# Patient Record
Sex: Male | Born: 1940 | Race: Black or African American | Hispanic: No | Marital: Married | Smoking: Current every day smoker
Health system: Southern US, Community
[De-identification: ages and names within clinical notes are randomized; demographics above are authoritative.]

## PROBLEM LIST (undated history)

## (undated) ENCOUNTER — Emergency Department (HOSPITAL_COMMUNITY): Admission: EM | Payer: Medicare Other | Source: Home / Self Care

## (undated) DIAGNOSIS — I10 Essential (primary) hypertension: Secondary | ICD-10-CM

## (undated) DIAGNOSIS — M1990 Unspecified osteoarthritis, unspecified site: Secondary | ICD-10-CM

## (undated) HISTORY — DX: Essential (primary) hypertension: I10

## (undated) HISTORY — DX: Unspecified osteoarthritis, unspecified site: M19.90

---

## 2010-04-08 ENCOUNTER — Emergency Department (HOSPITAL_COMMUNITY): Admission: EM | Admit: 2010-04-08 | Discharge: 2010-04-08 | Payer: Self-pay | Admitting: Family Medicine

## 2010-10-31 ENCOUNTER — Emergency Department (HOSPITAL_COMMUNITY): Admission: EM | Admit: 2010-10-31 | Discharge: 2010-10-31 | Payer: Self-pay | Admitting: Emergency Medicine

## 2010-10-31 ENCOUNTER — Emergency Department (HOSPITAL_COMMUNITY): Admission: EM | Admit: 2010-10-31 | Discharge: 2010-10-31 | Payer: Self-pay | Admitting: Family Medicine

## 2011-02-27 LAB — SYNOVIAL CELL COUNT + DIFF, W/ CRYSTALS: Monocyte-Macrophage-Synovial Fluid: 3 % — ABNORMAL LOW (ref 50–90)

## 2011-06-23 ENCOUNTER — Emergency Department (HOSPITAL_COMMUNITY)
Admission: EM | Admit: 2011-06-23 | Discharge: 2011-06-24 | Disposition: A | Discharge status: Home / Self Care | Payer: Medicare HMO | Attending: Emergency Medicine | Admitting: Emergency Medicine

## 2011-06-23 DIAGNOSIS — I10 Essential (primary) hypertension: Secondary | ICD-10-CM | POA: Insufficient documentation

## 2011-06-23 DIAGNOSIS — L03119 Cellulitis of unspecified part of limb: Secondary | ICD-10-CM | POA: Insufficient documentation

## 2011-06-23 DIAGNOSIS — M109 Gout, unspecified: Secondary | ICD-10-CM | POA: Insufficient documentation

## 2011-06-23 DIAGNOSIS — M79609 Pain in unspecified limb: Secondary | ICD-10-CM | POA: Insufficient documentation

## 2011-06-23 DIAGNOSIS — M25476 Effusion, unspecified foot: Secondary | ICD-10-CM | POA: Insufficient documentation

## 2011-06-23 DIAGNOSIS — L02619 Cutaneous abscess of unspecified foot: Secondary | ICD-10-CM | POA: Insufficient documentation

## 2011-06-23 DIAGNOSIS — M25473 Effusion, unspecified ankle: Secondary | ICD-10-CM | POA: Insufficient documentation

## 2011-06-24 ENCOUNTER — Emergency Department (HOSPITAL_COMMUNITY): Payer: Medicare HMO

## 2011-06-24 LAB — BASIC METABOLIC PANEL
BUN: 19 mg/dL (ref 6–23)
Calcium: 9.4 mg/dL (ref 8.4–10.5)
Creatinine, Ser: 0.8 mg/dL (ref 0.50–1.35)
GFR calc non Af Amer: >60 mL/min (ref >60)
Potassium: 3.3 mEq/L — ABNORMAL LOW (ref 3.5–5.1)
Sodium: 136 mEq/L (ref 135–145)

## 2011-06-24 LAB — CBC
HCT: 41.8 % (ref 39.0–52.0)
Hemoglobin: 14.2 g/dL (ref 13.0–17.0)
RDW: 15.2 % (ref 11.5–15.5)
WBC: 6.7 10*3/uL (ref 4.0–10.5)

## 2011-06-24 LAB — DIFFERENTIAL
Basophils Relative: 0 % (ref 0–1)
Eosinophils Absolute: 0.3 10*3/uL (ref 0.0–0.7)
Eosinophils Relative: 4 % (ref 0–5)
Lymphs Abs: 2 10*3/uL (ref 0.7–4.0)
Monocytes Absolute: 0.8 10*3/uL (ref 0.1–1.0)
Monocytes Relative: 12 % (ref 3–12)
Neutro Abs: 3.5 10*3/uL (ref 1.7–7.7)

## 2013-01-23 IMAGING — CR DG FOOT COMPLETE 3+V*R*
3 series · 3 of 3 positions shown · non-contrast
Comparison: None.

CLINICAL DATA: Pain and swelling

RIGHT FOOT COMPLETE - 3+ VIEW

[t foot ap right *]
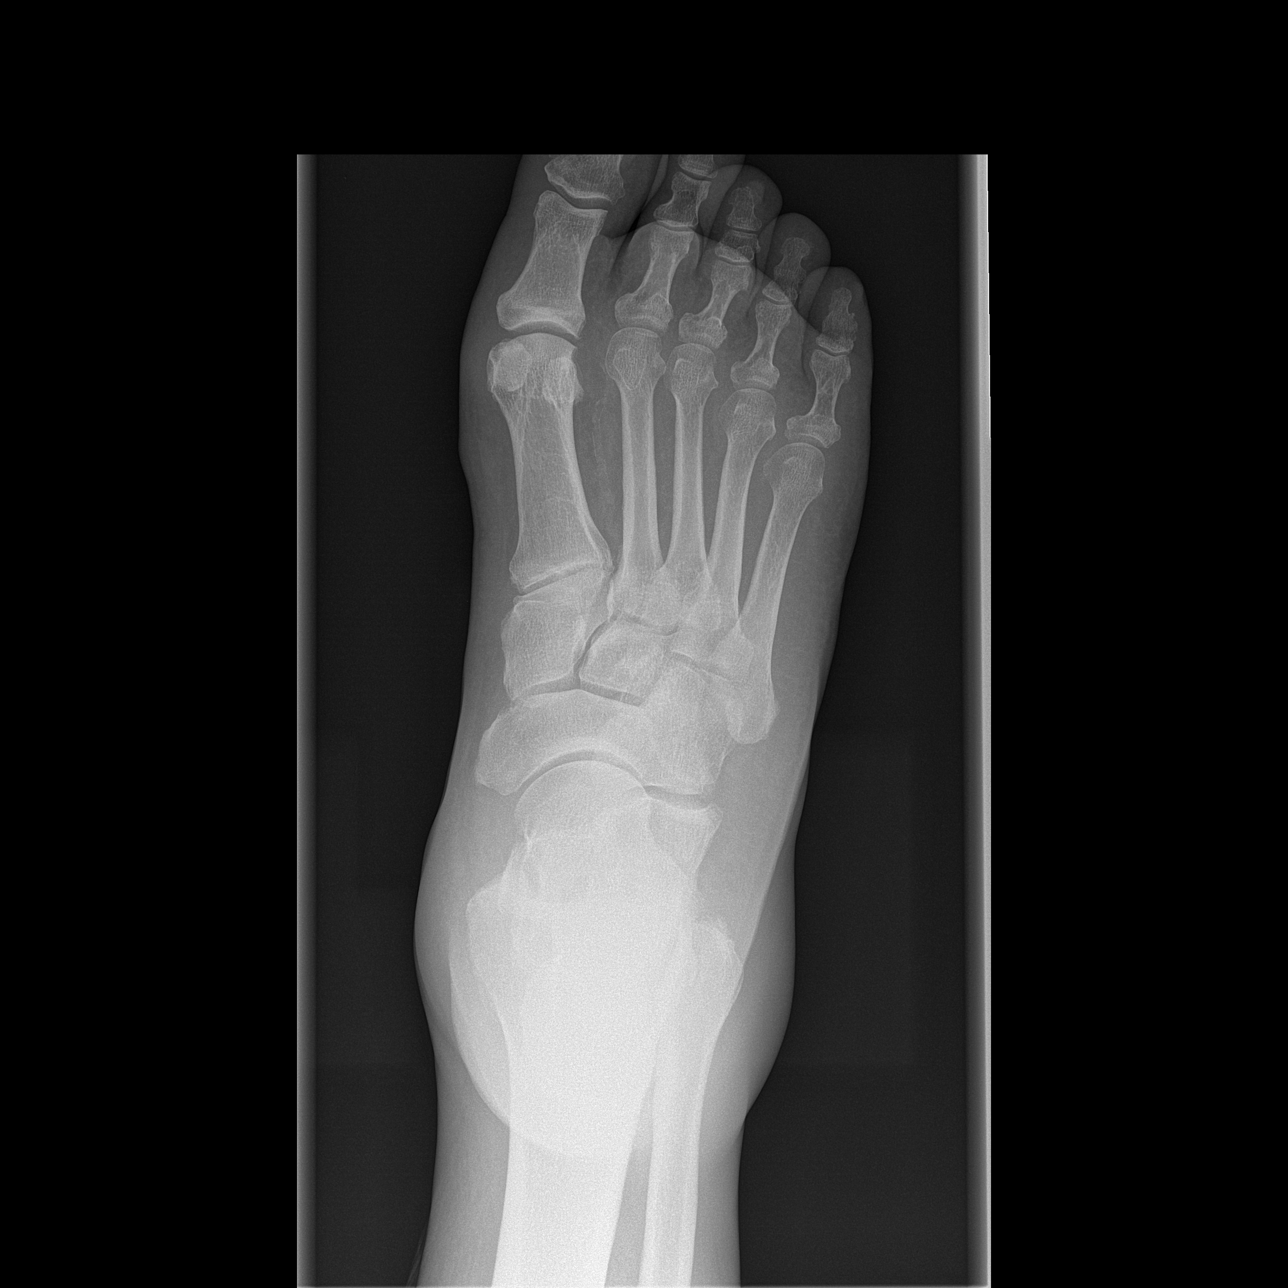

[t foot oblique right *]
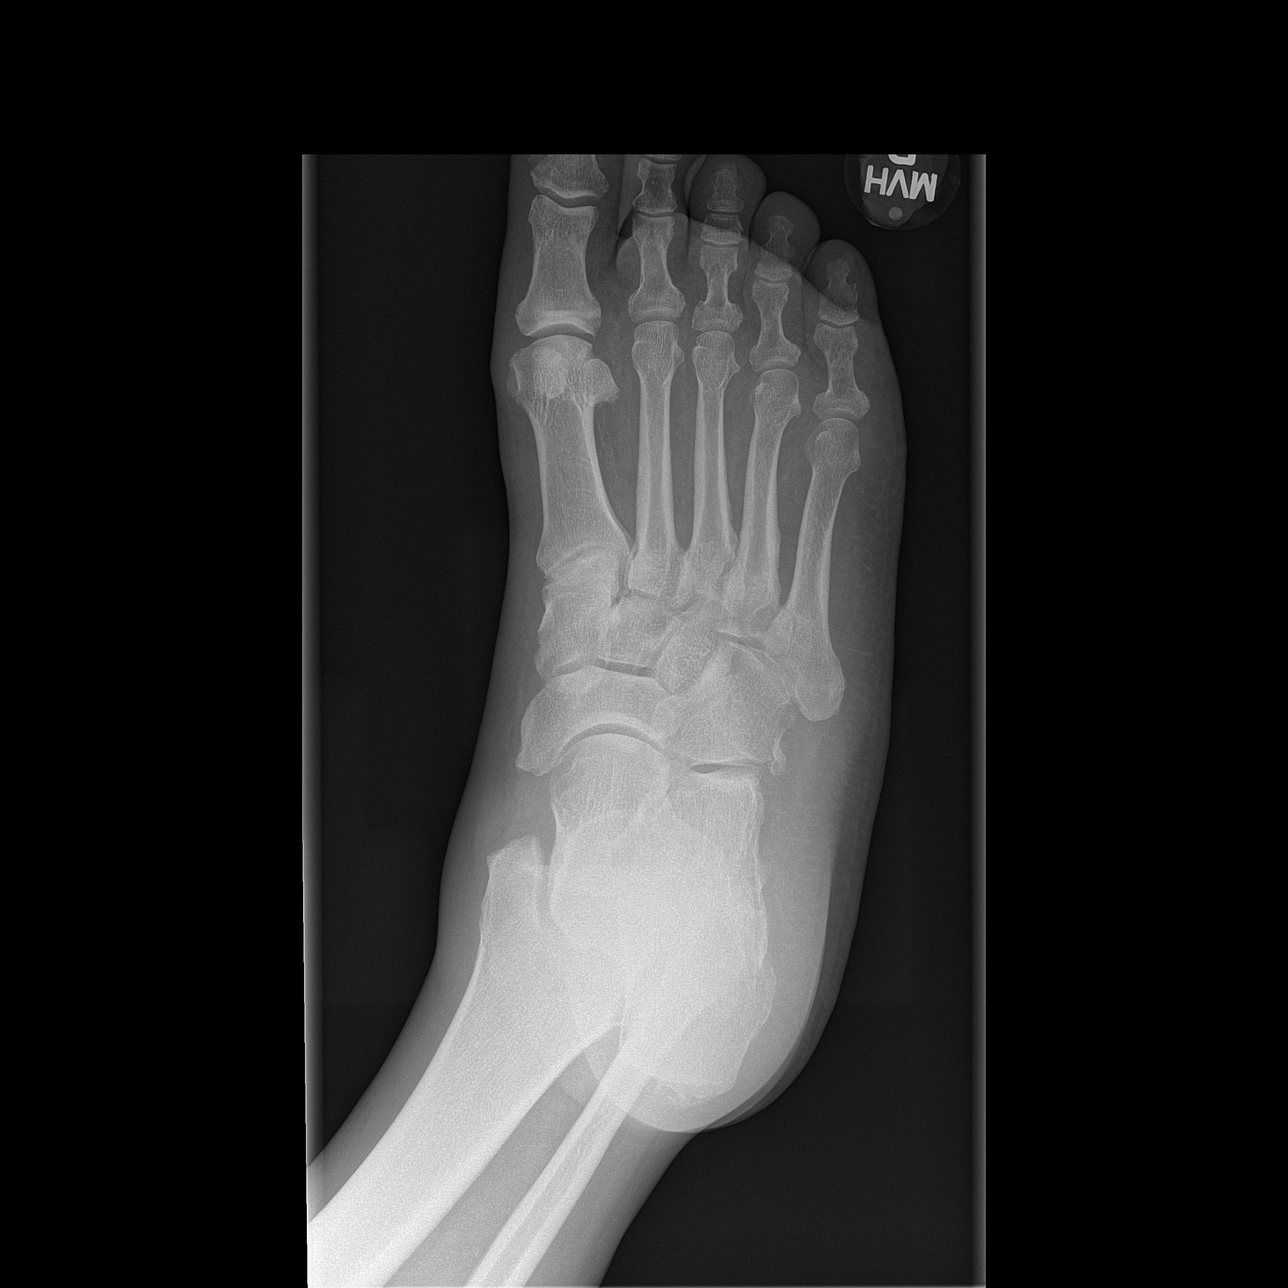

[t foot lat right *]
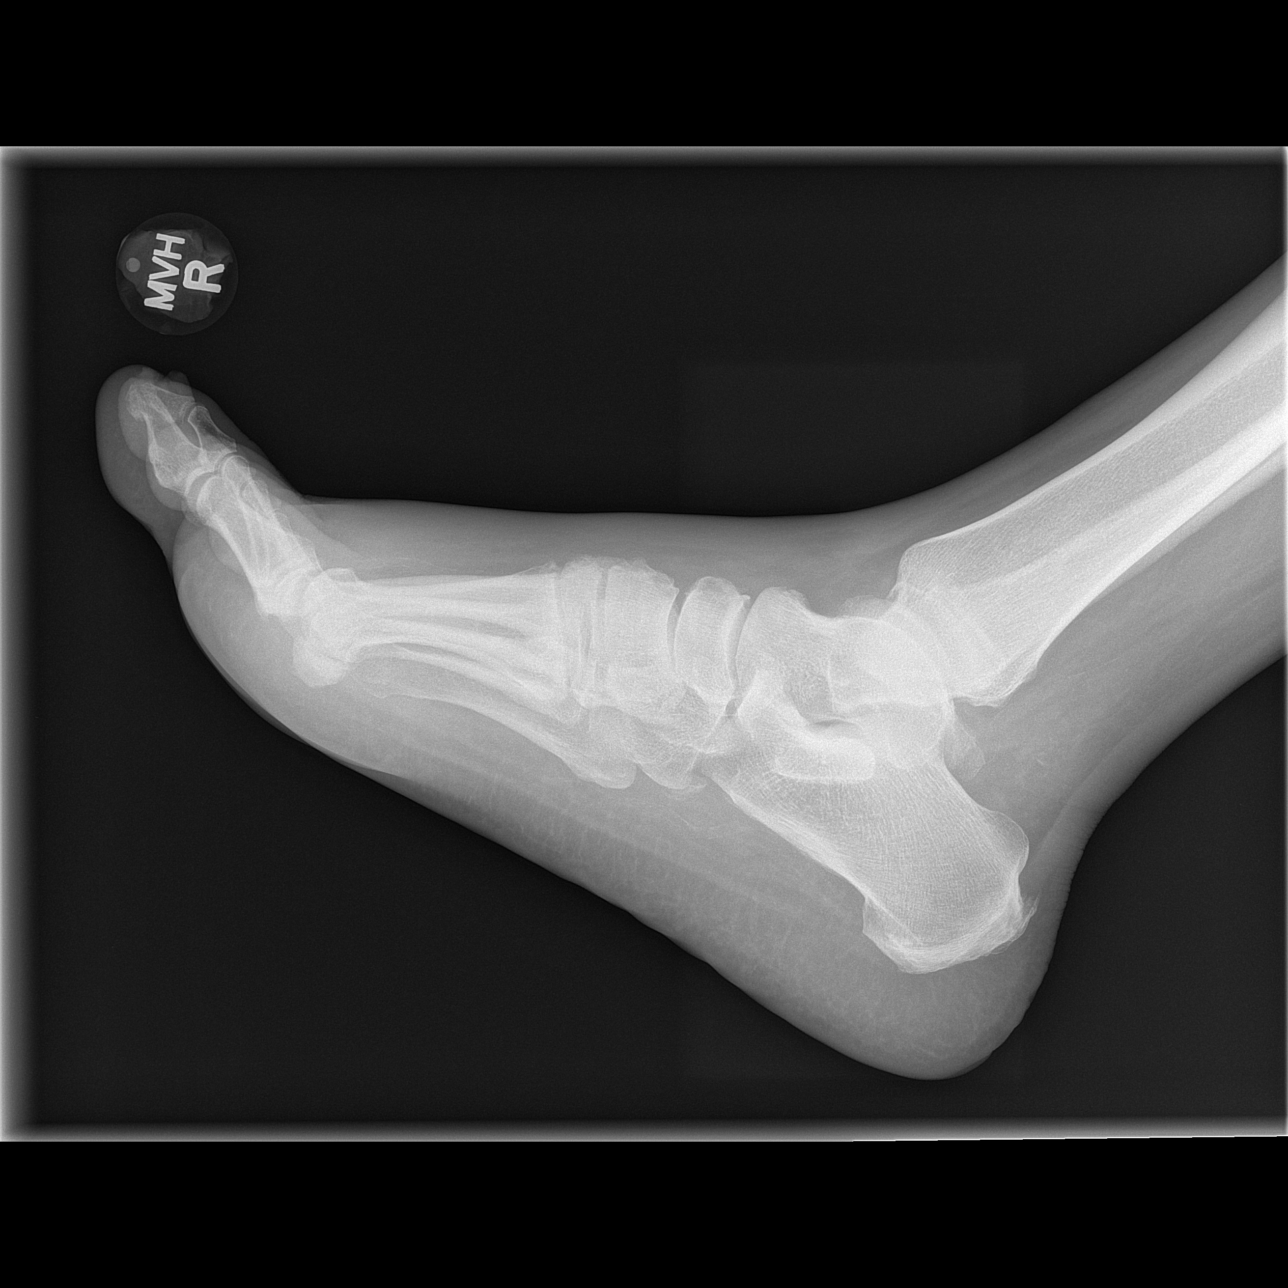

[3 of 3 positions shown; findings below may reference images not displayed]

FINDINGS: No evidence of fracture or dislocation of right foot.
There is mild swelling in the forefoot.  No evidence of foreign
body.  No subcutaneous gas.  There are vascular calcification is
noted.
IMPRESSION: Soft tissue swelling without osseous abnormality.

## 2014-09-15 HISTORY — PX: JOINT REPLACEMENT: SHX530

## 2014-09-20 ENCOUNTER — Non-Acute Institutional Stay (SKILLED_NURSING_FACILITY): Payer: Medicare HMO | Admitting: Adult Health

## 2014-09-20 ENCOUNTER — Encounter: Payer: Self-pay | Admitting: *Deleted

## 2014-09-20 ENCOUNTER — Encounter: Payer: Self-pay | Admitting: Adult Health

## 2014-09-20 DIAGNOSIS — M109 Gout, unspecified: Secondary | ICD-10-CM | POA: Insufficient documentation

## 2014-09-20 DIAGNOSIS — I10 Essential (primary) hypertension: Secondary | ICD-10-CM | POA: Insufficient documentation

## 2014-09-20 DIAGNOSIS — K219 Gastro-esophageal reflux disease without esophagitis: Secondary | ICD-10-CM

## 2014-09-20 DIAGNOSIS — M1712 Unilateral primary osteoarthritis, left knee: Secondary | ICD-10-CM

## 2014-09-20 DIAGNOSIS — K5900 Constipation, unspecified: Secondary | ICD-10-CM | POA: Insufficient documentation

## 2014-09-21 ENCOUNTER — Emergency Department (HOSPITAL_COMMUNITY)
Admission: EM | Admit: 2014-09-21 | Discharge: 2014-10-17 | Disposition: E | Payer: Medicare HMO | Attending: Emergency Medicine | Admitting: Emergency Medicine

## 2014-09-21 ENCOUNTER — Emergency Department (HOSPITAL_COMMUNITY): Payer: Medicare HMO

## 2014-09-21 ENCOUNTER — Encounter (HOSPITAL_COMMUNITY): Payer: Self-pay | Admitting: Emergency Medicine

## 2014-09-21 DIAGNOSIS — Z7901 Long term (current) use of anticoagulants: Secondary | ICD-10-CM | POA: Diagnosis not present

## 2014-09-21 DIAGNOSIS — Z720 Tobacco use: Secondary | ICD-10-CM | POA: Diagnosis not present

## 2014-09-21 DIAGNOSIS — R4020 Unspecified coma: Secondary | ICD-10-CM | POA: Diagnosis not present

## 2014-09-21 DIAGNOSIS — I10 Essential (primary) hypertension: Secondary | ICD-10-CM | POA: Insufficient documentation

## 2014-09-21 DIAGNOSIS — I469 Cardiac arrest, cause unspecified: Secondary | ICD-10-CM | POA: Insufficient documentation

## 2014-09-21 DIAGNOSIS — Z96659 Presence of unspecified artificial knee joint: Secondary | ICD-10-CM | POA: Insufficient documentation

## 2014-09-21 DIAGNOSIS — Z79899 Other long term (current) drug therapy: Secondary | ICD-10-CM | POA: Diagnosis not present

## 2014-09-21 DIAGNOSIS — M13869 Other specified arthritis, unspecified knee: Secondary | ICD-10-CM | POA: Diagnosis not present

## 2014-09-21 LAB — I-STAT CHEM 8, ED
BUN: 57 mg/dL — ABNORMAL HIGH (ref 6–23)
CALCIUM ION: 1.1 mmol/L — AB (ref 1.13–1.30)
CREATININE: 2.9 mg/dL — AB (ref 0.50–1.35)
Chloride: 101 mEq/L (ref 96–112)
GLUCOSE: 109 mg/dL — AB (ref 70–99)
HCT: 36 % — ABNORMAL LOW (ref 39.0–52.0)
HEMOGLOBIN: 12.2 g/dL — AB (ref 13.0–17.0)
POTASSIUM: 4.5 meq/L (ref 3.7–5.3)
Sodium: 125 mEq/L — ABNORMAL LOW (ref 137–147)
TCO2: 21 mmol/L (ref 0–100)

## 2014-09-21 LAB — COMPREHENSIVE METABOLIC PANEL
ALBUMIN: 2 g/dL — AB (ref 3.5–5.2)
ALK PHOS: 114 U/L (ref 39–117)
ALT: 68 U/L — ABNORMAL HIGH (ref 0–53)
AST: 123 U/L — ABNORMAL HIGH (ref 0–37)
Anion gap: 21 — ABNORMAL HIGH (ref 5–15)
BILIRUBIN TOTAL: 0.6 mg/dL (ref 0.3–1.2)
BUN: 57 mg/dL — ABNORMAL HIGH (ref 6–23)
CALCIUM: 10.4 mg/dL (ref 8.4–10.5)
CHLORIDE: 86 meq/L — AB (ref 96–112)
CO2: 21 meq/L (ref 19–32)
CREATININE: 3.23 mg/dL — AB (ref 0.50–1.35)
GFR, EST AFRICAN AMERICAN: 20 mL/min — AB (ref >90)
GFR, EST NON AFRICAN AMERICAN: 17 mL/min — AB (ref >90)
GLUCOSE: 136 mg/dL — AB (ref 70–99)
Potassium: 6.2 mEq/L — ABNORMAL HIGH (ref 3.7–5.3)
Sodium: 128 mEq/L — ABNORMAL LOW (ref 137–147)
TOTAL PROTEIN: 6.4 g/dL (ref 6.0–8.3)

## 2014-09-21 LAB — CBC WITH DIFFERENTIAL/PLATELET
BASOS PCT: 1 % (ref 0–1)
Basophils Absolute: 0 10*3/uL (ref 0.0–0.1)
EOS ABS: 0 10*3/uL (ref 0.0–0.7)
Eosinophils Relative: 0 % (ref 0–5)
HCT: 33.2 % — ABNORMAL LOW (ref 39.0–52.0)
Hemoglobin: 10.9 g/dL — ABNORMAL LOW (ref 13.0–17.0)
LYMPHS ABS: 0.9 10*3/uL (ref 0.7–4.0)
LYMPHS PCT: 25 % (ref 12–46)
MCH: 29.5 pg (ref 26.0–34.0)
MCHC: 32.8 g/dL (ref 30.0–36.0)
MCV: 90 fL (ref 78.0–100.0)
MONOS PCT: 12 % (ref 3–12)
Monocytes Absolute: 0.4 10*3/uL (ref 0.1–1.0)
NEUTROS ABS: 2.3 10*3/uL (ref 1.7–7.7)
NEUTROS PCT: 62 % (ref 43–77)
PLATELETS: 176 10*3/uL (ref 150–400)
RBC: 3.69 MIL/uL — ABNORMAL LOW (ref 4.22–5.81)
RDW: 14 % (ref 11.5–15.5)
WBC: 3.6 10*3/uL — ABNORMAL LOW (ref 4.0–10.5)

## 2014-09-21 LAB — I-STAT TROPONIN, ED: TROPONIN I, POC: 0.01 ng/mL (ref 0.00–0.08)

## 2014-09-21 LAB — PROTIME-INR
INR: 2.51 — ABNORMAL HIGH (ref 0.00–1.49)
PROTHROMBIN TIME: 27.1 s — AB (ref 11.6–15.2)

## 2014-09-21 LAB — TROPONIN I

## 2014-09-21 LAB — I-STAT CG4 LACTIC ACID, ED: LACTIC ACID, VENOUS: 14.73 mmol/L — AB (ref 0.5–2.2)

## 2014-09-21 MED ORDER — NOREPINEPHRINE BITARTRATE 1 MG/ML IV SOLN
10.0000 ug/min | Freq: Once | INTRAVENOUS | Status: AC
  Administered 2014-09-21: 10 ug/min via INTRAVENOUS
  Filled 2014-09-21: qty 4

## 2014-09-21 MED ORDER — EPINEPHRINE HCL 0.1 MG/ML IJ SOSY
PREFILLED_SYRINGE | INTRAMUSCULAR | Status: AC | PRN
  Administered 2014-09-21 (×3): 1 via INTRAVENOUS

## 2014-09-21 MED ORDER — EPINEPHRINE HCL 1 MG/ML IJ SOLN
0.5000 ug/min | INTRAVENOUS | Status: DC
  Administered 2014-09-21: 10 ug/min via INTRAVENOUS
  Filled 2014-09-21: qty 4

## 2014-09-21 MED ORDER — SODIUM CHLORIDE 0.9 % IV SOLN
INTRAVENOUS | Status: AC | PRN
  Administered 2014-09-21 (×3): 999 mL/h via INTRAVENOUS

## 2014-09-21 MED ORDER — SODIUM BICARBONATE 8.4 % IV SOLN
INTRAVENOUS | Status: AC | PRN
  Administered 2014-09-21 (×2): 50 meq via INTRAVENOUS

## 2014-09-21 MED ORDER — ATROPINE SULFATE 1 MG/ML IJ SOLN
INTRAMUSCULAR | Status: AC | PRN
  Administered 2014-09-21 (×2): 1 mg via INTRAVENOUS

## 2014-09-22 MED FILL — Medication: Qty: 1 | Status: AC

## 2014-10-17 NOTE — Code Documentation (Signed)
CPR paused. No pulses. CPR resumed.  

## 2014-10-17 NOTE — ED Notes (Signed)
Pacing paused per Dr. Radford PaxBeaton. Pts HR remains irregular, rate 35-42. Pt restarted on pacing.

## 2014-10-17 NOTE — ED Notes (Signed)
Started pacing patient, Rate at 70, capture at 50

## 2014-10-17 NOTE — Code Documentation (Signed)
Pt has weak femoral pulse.

## 2014-10-17 NOTE — Code Documentation (Signed)
Strong femoral pulse.  CPR stopped.

## 2014-10-17 NOTE — Progress Notes (Signed)
Patient ID: Tyler Campos, male   DOB: 1941-07-20, 73 y.o.   MRN: [id number]               PROGRESS NOTE  DATE: 09/20/2014  FACILITY: Nursing Home [facility]  LEVEL OF CARE: SNF (31)  Acute Visit  CHIEF COMPLAINT: Follow-up Hospitalization  HISTORY OF PRESENT ILLNESS:  This is a 73 year old male who [facility] with DJD S/P Left total knee replacement.  REASSESSMENT OF ONGOING PROBLEM(S):  HTN: Pt 's HTN remains stable.  Denies CP, sob, DOE, headaches, dizziness or visual disturbances.  No complications from the medications currently being used.  Last BP : 112/71  GERD: pt's GERD is stable.  Denies ongoing heartburn, abd. Pain, nausea or vomiting.  Currently on a PPI & tolerates it without any adverse reactions.  GOUT: Patien'st  gout remains stable. Patient denies joint pan, redness, swelling or warmth. No complications reported from the medications presently being used.  PAST MEDICAL HISTORY : Reviewed.  No changes/see problem list  CURRENT MEDICATIONS: Reviewed per MAR/see medication list  REVIEW OF SYSTEMS:  GENERAL: no change in appetite, no fatigue, no weight changes, no fever, chills or weakness RESPIRATORY: no cough, SOB, DOE, wheezing, hemoptysis CARDIAC: no chest pain, or palpitations, +edema GI: no abdominal pain, diarrhea, heart burn, nausea or vomiting, +constipation  PHYSICAL EXAMINATION  GENERAL: no acute distress, normal body habitus SKIN:  Left knee surgical wound is dry, no redness EYES: conjunctivae normal, sclerae normal, normal eye lids NECK: supple, trachea midline, no neck masses, no thyroid tenderness, no thyromegaly LYMPHATICS: no LAN in the neck, no supraclavicular LAN RESPIRATORY: breathing is even & unlabored, BS CTAB CARDIAC: RRR, no murmur,no extra heart sounds, no edema GI: abdomen soft, normal BS, no masses, no tenderness, no hepatomegaly, no  splenomegaly EXTREMITIES:  Able to move all 4 extremities PSYCHIATRIC: the patient is alert & oriented to person, affect & behavior appropriate  LABS/RADIOLOGY: 09/17/14  hemoglobin 12.8 hematocrit 37.1 INR 1.33 09/16/14  WBC 9.6 hemoglobin 13.0 hematocrit 38.5 MCV 90.2 sodium 132 potassium 4.2  glucose 117 BUN 8 creatinine 0.73 calcium 9.4  ASSESSMENT/PLAN:  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

## 2014-10-17 NOTE — Code Documentation (Signed)
Critical care at bedside  

## 2014-10-17 NOTE — ED Notes (Addendum)
Blood gas results shown to Dr. Radford PaxBeaton @ bedside Trauma-A,? Venous results, sample drawn by Dr. Radford PaxBeaton, from left Femoral site.

## 2014-10-17 NOTE — ED Notes (Signed)
Per EMS: [facility] where he is being tx for a left total knee replacement. Per staff, pt was seen talking at 610 AM. Pt then found unresponsive with no pulses at 0630 AM, CPR started. On EMS arrival at [phone] pt was found in asystole. Pt was given 10 amps of EPI. CBG 243. 7 ETT placed. Pt being bagged on arrival, CPR in progress. Pt remained in asystole with EMS during transport.

## 2014-10-17 NOTE — Code Documentation (Signed)
Pt has strong femoral pulse.

## 2014-10-17 NOTE — Progress Notes (Signed)
Chaplain responded to page from ED. Upon arrival, spent time with pt's daughter and daughter's friend, providing emotional and grief support. Chaplain accompanied daughter to view her father's body and facilitated getting info from dr. Lunette Standshaplain gave daughter patient placement card and let her know to call when they have decided what to do with pt's body.  Wille GlaserMcCray, Tishina Lown O, Chaplain 10/12/2014 9:04 AM

## 2014-10-17 NOTE — Code Documentation (Signed)
This RN and Md Radford PaxBeaton updated family on pt's status. Chaplain sitting with family.

## 2014-10-17 NOTE — Code Documentation (Signed)
Patient time of death occurred at 0830 

## 2014-10-17 NOTE — ED Notes (Signed)
Chem 8 results given to Dr. Beaton 

## 2014-10-17 NOTE — ED Notes (Signed)
Pt. arrived intubated with 7.0 E.T. tube with commercial holder in place/secure/pilot balloon inflated, being bagged with Oxygen/EtC02 monitor in place per [facility], took over manual ventilations after connecting AMBU bag to Oxygen in Trauma-A, ETCO2 cap placed (+), b/l b.s. noted, NG in place R nare-secured with clear/plastic tape where found upon arrival, placed on Vent per standing protocol settings, airway suctioned, awaiting CXR, RT to monitor.

## 2014-10-17 NOTE — ED Notes (Signed)
Lactic acid results given to the primary nurse [facility]

## 2014-10-17 NOTE — ED Provider Notes (Signed)
CSN: [id number]     Arrival date & time 10/09/2014  4098 History   First MD Initiated Contact with Patient 10-09-2014 [phone]     Chief Complaint  Patient presents with  . Cardiac Arrest      HPI  Per EMS: Pt from [facility] facility where he is being tx for a left total knee replacement. Per staff, pt was seen talking at 610 AM. Pt then found unresponsive with no pulses at 0630 AM, CPR started. On EMS arrival at [phone] pt was found in asystole. Pt was given 10 amps of EPI. CBG 243. 7 ETT placed. Pt being bagged on arrival, CPR in progress. Pt remained in asystole with EMS during transport.    Past Medical History  Diagnosis Date  . Hypertension   . Arthritis     left knee   Past Surgical History  Procedure Laterality Date  . Joint replacement Left 09/15/2014    knee arthroscopy   Family History  Problem Relation Age of Onset  . Heart disease Mother   . Cancer Mother   . Heart disease Father   . Cancer Father    History  Substance Use Topics  . Smoking status: Current Every Day Smoker -- 1.00 packs/day  . Smokeless tobacco: Not on file  . Alcohol Use: Yes    Review of Systems  Unable to perform ROS: Acuity of condition      Allergies  Review of patient's allergies indicates no known allergies.  Home Medications   Prior to Admission medications   Medication Sig Start Date End Date Taking? Authorizing Provider  allopurinol (ZYLOPRIM) 300 MG tablet Take 300 mg by mouth daily.   Yes Historical Provider, MD  diltiazem (DILACOR XR) 180 MG 24 hr capsule Take 180 mg by mouth daily.   Yes Historical Provider, MD  lisinopril-hydrochlorothiazide (PRINZIDE,ZESTORETIC) 20-12.5 MG per tablet Take 1 tablet by mouth 2 (two) times daily.   Yes Historical Provider, MD  omeprazole (PRILOSEC) 40 MG capsule Take 40 mg by mouth daily.   Yes Historical Provider, MD  oxyCODONE-acetaminophen (PERCOCET/ROXICET) 5-325 MG per tablet Take 1-2 tablets by mouth every 4 (four) hours as needed.    Yes  Historical Provider, MD  warfarin (COUMADIN) 5 MG tablet Take 5 mg by mouth at bedtime.   Yes Historical Provider, MD   BP 51/37  Pulse 32  Resp 20  Wt 208 lb (94.348 kg)  SpO2 76% Physical Exam  Nursing note and vitals reviewed. Constitutional: He appears well-developed. He appears distressed.  HENT:  Head: Normocephalic and atraumatic.  Eyes: Right pupil is reactive. Left pupil is reactive.  Neck: No tracheal deviation present.  Cardiovascular:  Patient is asystolic  Abdominal: Normal appearance. He exhibits no distension.  Neurological: He is unresponsive. GCS eye subscore is 1. GCS verbal subscore is 1. GCS motor subscore is 1.  Skin: No rash noted.    ED Course  Procedures (including critical care time)   CRITICAL CARE Performed by: Nelva Nay L Total critical care time: 30 min Critical care time was exclusive of separately billable procedures and treating other patients. Critical care was necessary to treat or prevent imminent or life-threatening deterioration. Critical care was time spent personally by me on the following activities: development of treatment plan with patient and/or surrogate as well as nursing, discussions with consultants, evaluation of patient's response to treatment, examination of patient, obtaining history from patient or surrogate, ordering and performing treatments and interventions, ordering and review of laboratory studies, ordering  and review of radiographic studies, pulse oximetry and re-evaluation of patient's condition.   Labs Review Labs Reviewed  PROTIME-INR - Abnormal; Notable for the following:    Prothrombin Time 27.1 (*)    INR 2.51 (*)    All other components within normal limits  CBC WITH DIFFERENTIAL - Abnormal; Notable for the following:    WBC 3.6 (*)    RBC 3.69 (*)    Hemoglobin 10.9 (*)    HCT 33.2 (*)    All other components within normal limits  COMPREHENSIVE METABOLIC PANEL - Abnormal; Notable for the following:     Sodium 128 (*)    Potassium 6.2 (*)    Chloride 86 (*)    Glucose, Bld 136 (*)    BUN 57 (*)    Creatinine, Ser 3.23 (*)    Albumin 2.0 (*)    AST 123 (*)    ALT 68 (*)    GFR calc non Af Amer 17 (*)    GFR calc Af Amer 20 (*)    Anion gap 21 (*)    All other components within normal limits  I-STAT CHEM 8, ED - Abnormal; Notable for the following:    Sodium 125 (*)    BUN 57 (*)    Creatinine, Ser 2.90 (*)    Glucose, Bld 109 (*)    Calcium, Ion 1.10 (*)    Hemoglobin 12.2 (*)    HCT 36.0 (*)    All other components within normal limits  I-STAT CG4 LACTIC ACID, ED - Abnormal; Notable for the following:    Lactic Acid, Venous 14.73 (*)    All other components within normal limits  TROPONIN I  I-STAT TROPOININ, ED   Patient developed a pulse after arrival for a brief period of time but then deteriorated rapidly. Imaging Review patient continued to deteriorate.  After brief return of pulse he then went into cardiac arrest.  Bedside ultrasound revealed no cardiac activity.  Patient does coded further and pronounced dead at 0830. The medical examiner was contacted patient be transported to the morgue as a medical examiner case. No results found.    MDM   Final diagnoses:  None        Nelia Shiobert L Tsion Inghram, MD 09/25/14 [phone]

## 2014-10-17 DEATH — deceased
